# Patient Record
Sex: Male | Born: 1995 | Race: White | Hispanic: No | Marital: Single | Smoking: Never smoker
Health system: Southern US, Community
[De-identification: ages and names within clinical notes are randomized; demographics above are authoritative.]

## PROBLEM LIST (undated history)

## (undated) ENCOUNTER — Ambulatory Visit: Payer: 59 | Source: Home / Self Care

## (undated) DIAGNOSIS — IMO0001 Reserved for inherently not codable concepts without codable children: Secondary | ICD-10-CM

## (undated) HISTORY — PX: SURGERY SCROTAL / TESTICULAR: SUR1316

---
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
## 2018-10-04 ENCOUNTER — Ambulatory Visit
Admission: EM | Admit: 2018-10-04 | Discharge: 2018-10-04 | Disposition: A | Discharge status: Home / Self Care | Payer: Self-pay | Attending: Physician Assistant | Admitting: Physician Assistant

## 2018-10-04 ENCOUNTER — Other Ambulatory Visit: Payer: Self-pay

## 2018-10-04 DIAGNOSIS — J302 Other seasonal allergic rhinitis: Secondary | ICD-10-CM

## 2018-10-04 MED ORDER — IPRATROPIUM BROMIDE 0.06 % NA SOLN: 2.0000 | Freq: Four times a day (QID) | NASAL | 12 refills | Status: DC

## 2018-10-04 MED ORDER — CETIRIZINE HCL 10 MG PO TABS: 10.0000 mg | ORAL_TABLET | Freq: Every day | ORAL | 0 refills | Status: DC

## 2018-10-04 MED ORDER — FLUTICASONE PROPIONATE 50 MCG/ACT NA SUSP: 2.0000 | Freq: Every day | NASAL | 0 refills | Status: DC

## 2018-10-04 NOTE — Discharge Instructions (Signed)
Start zyrtec as directed. Start flonase, atrovent nasal spray for nasal congestion/drainage. You can use over the counter nasal saline rinse such as neti pot for nasal congestion. Keep hydrated, your urine should be clear to pale yellow in color. Monitor for any worsening of symptoms, chest pain, shortness of breath, wheezing, swelling of the throat, follow up for reevaluation.

## 2018-10-04 NOTE — ED Provider Notes (Signed)
[facility]    CSN: [id number] Arrival date & time: 10/04/18  1705     History   Chief Complaint Chief Complaint  Patient presents with  . Facial Pain    HPI Justin Guzman is a 23 y.o. male.   23 year old male comes in for 2-day history of left-sided facial pain/pressure.  He also has postnasal drip.  Denies rhinorrhea, nasal congestion, cough.  Denies fever, chills, night sweats.  Denies photophobia, phonophobia, nausea, vomiting.  Denies shortness of breath, wheezing.  Denies dental pain, gum swelling.  Denies recent sick contact.  Denies COVID contact.  Took ibuprofen with some relief.  Noticed some colored mucus that he "forced to come up" and came in for evaluation of possible sinus infection.     Past Medical History:  Diagnosis Date  . Undescended and retracted testis     There are no active problems to display for this patient.   Past Surgical History:  Procedure Laterality Date  . SURGERY SCROTAL / TESTICULAR         Home Medications    Prior to Admission medications   Medication Sig Start Date End Date Taking? Authorizing Provider  cetirizine (ZYRTEC ALLERGY) 10 MG tablet Take 1 tablet (10 mg total) by mouth daily. 10/04/18   Cathie Hoops, Amy V, PA-C  fluticasone (FLONASE) 50 MCG/ACT nasal spray Place 2 sprays into both nostrils daily. 10/04/18   Cathie Hoops, Amy V, PA-C  ipratropium (ATROVENT) 0.06 % nasal spray Place 2 sprays into both nostrils 4 (four) times daily. 10/04/18   Belinda Fisher, PA-C    Family History No family history on file.  Social History Social History   Tobacco Use  . Smoking status: Never Smoker  . Smokeless tobacco: Never Used  Substance Use Topics  . Alcohol use: Not Currently  . Drug use: Not on file     Allergies   Patient has no known allergies.   Review of Systems Review of Systems  Reason unable to perform ROS: See HPI as above.     Physical Exam Triage Vital Signs ED Triage Vitals [10/04/18 1727]  Enc Vitals  Group     BP (!) 153/88     Pulse Rate 90     Resp 18     Temp 98.1 F (36.7 C)     Temp Source Oral     SpO2 98 %     Weight      Height      Head Circumference      Peak Flow      Pain Score 4     Pain Loc      Pain Edu?      Excl. in GC?    No data found.  Updated Vital Signs BP (!) 153/88 (BP Location: Left Arm)   Pulse 90   Temp 98.1 F (36.7 C) (Oral)   Resp 18   SpO2 98%   Physical Exam Constitutional:      General: He is not in acute distress.    Appearance: He is well-developed. He is not ill-appearing, toxic-appearing or diaphoretic.  HENT:     Head: Normocephalic and atraumatic.     Right Ear: Ear canal and external ear normal. A middle ear effusion is present. Tympanic membrane is not erythematous or bulging.     Left Ear: Ear canal and external ear normal. A middle ear effusion is present. Tympanic membrane is scarred. Tympanic membrane is not erythematous or bulging.  Nose: Nose normal.     Right Sinus: No maxillary sinus tenderness or frontal sinus tenderness.     Left Sinus: No maxillary sinus tenderness or frontal sinus tenderness.     Mouth/Throat:     Mouth: Mucous membranes are moist.     Pharynx: Oropharynx is clear. Uvula midline.  Eyes:     Conjunctiva/sclera: Conjunctivae normal.     Pupils: Pupils are equal, round, and reactive to light.  Neck:     Musculoskeletal: Normal range of motion and neck supple.  Cardiovascular:     Rate and Rhythm: Normal rate and regular rhythm.     Heart sounds: Normal heart sounds. No murmur. No friction rub. No gallop.   Pulmonary:     Effort: Pulmonary effort is normal. No accessory muscle usage, prolonged expiration, respiratory distress or retractions.     Breath sounds: Normal breath sounds. No stridor, decreased air movement or transmitted upper airway sounds. No decreased breath sounds, wheezing, rhonchi or rales.  Skin:    General: Skin is warm and dry.  Neurological:     Mental Status: He is  alert and oriented to person, place, and time.      UC Treatments / Results  Labs (all labs ordered are listed, but only abnormal results are displayed) Labs Reviewed - No data to display  EKG None  Radiology No results found.  Procedures Procedures (including critical care time)  Medications Ordered in UC Medications - No data to display  Initial Impression / Assessment and Plan / UC Course  I have reviewed the triage vital signs and the nursing notes.  Pertinent labs & imaging results that were available during my care of the patient were reviewed by me and considered in my medical decision making (see chart for details).    History and exam more consistent with allergic rhinitis. No fever, cough, shortness of breath, body aches, chills, lower suspicion for viral process. Symptomatic treatment discussed. Push fluids. Return precautions given. Patient expresses understanding and agrees to plan.  Final Clinical Impressions(s) / UC Diagnoses   Final diagnoses:  Seasonal allergic rhinitis, unspecified trigger    ED Prescriptions    Medication Sig Dispense Auth. Provider   fluticasone (FLONASE) 50 MCG/ACT nasal spray Place 2 sprays into both nostrils daily. 1 g Yu, Amy V, PA-C   cetirizine (ZYRTEC ALLERGY) 10 MG tablet Take 1 tablet (10 mg total) by mouth daily. 30 tablet Yu, Amy V, PA-C   ipratropium (ATROVENT) 0.06 % nasal spray Place 2 sprays into both nostrils 4 (four) times daily. 15 mL Threasa AlphaYu, Amy V, PA-C        Yu, Amy V, [place] 10/04/18 1744

## 2018-10-04 NOTE — ED Triage Notes (Signed)
Pt c/o sinus pressure to lt side of face x2 days

## 2021-02-11 ENCOUNTER — Other Ambulatory Visit: Payer: Self-pay

## 2021-02-11 ENCOUNTER — Ambulatory Visit
Admission: EM | Admit: 2021-02-11 | Discharge: 2021-02-11 | Disposition: A | Discharge status: Home / Self Care | Payer: 59 | Attending: Urgent Care | Admitting: Urgent Care

## 2021-02-11 DIAGNOSIS — H8110 Benign paroxysmal vertigo, unspecified ear: Secondary | ICD-10-CM

## 2021-02-11 DIAGNOSIS — I10 Essential (primary) hypertension: Secondary | ICD-10-CM

## 2021-02-11 DIAGNOSIS — Z8249 Family history of ischemic heart disease and other diseases of the circulatory system: Secondary | ICD-10-CM

## 2021-02-11 MED ORDER — LOSARTAN POTASSIUM 25 MG PO TABS: 25.0000 mg | ORAL_TABLET | Freq: Every day | ORAL | 0 refills | Status: AC

## 2021-02-11 MED ORDER — MECLIZINE HCL 12.5 MG PO TABS: 12.5000 mg | ORAL_TABLET | Freq: Three times a day (TID) | ORAL | 0 refills | Status: DC | PRN

## 2021-02-11 NOTE — Discharge Instructions (Addendum)

## 2021-02-11 NOTE — ED Provider Notes (Signed)
[facility]   MRN: [id number] DOB: [birth year]  Subjective:   Justin Guzman is a 25 y.o. male presenting for dizziness and intermittent nausea for the past 5 days.  Patient states that the dizziness lasts several seconds to a minute.  It occurs randomly but also very consistently when he changes positions involving his head.  Has had slight intermittent headaches that are mild in nature.  Denies confusion, weakness, numbness or tingling, vision changes, ear pain, runny or stuffy nose, symptoms, cough, chest pain, shortness of breath, vomiting, abdominal pain.  Patient does not have any history of allergies.  Regarding his blood pressure, reports longstanding family history of heart disease, high blood pressure.  Would like to start blood pressure medications today.  Does not have a PCP.  No current facility-administered medications for this encounter.  Current Outpatient Medications:    cetirizine (ZYRTEC ALLERGY) 10 MG tablet, Take 1 tablet (10 mg total) by mouth daily., Disp: 30 tablet, Rfl: 0   fluticasone (FLONASE) 50 MCG/ACT nasal spray, Place 2 sprays into both nostrils daily., Disp: 1 g, Rfl: 0   ipratropium (ATROVENT) 0.06 % nasal spray, Place 2 sprays into both nostrils 4 (four) times daily., Disp: 15 mL, Rfl: 12   No Known Allergies  Past Medical History:  Diagnosis Date   Undescended and retracted testis      Past Surgical History:  Procedure Laterality Date   SURGERY SCROTAL / TESTICULAR      History reviewed. No pertinent family history.  Social History   Tobacco Use   Smoking status: Never   Smokeless tobacco: Never  Substance Use Topics   Alcohol use: Not Currently    ROS   Objective:   Vitals: BP (!) 163/94 (BP Location: Left Arm)   Pulse 85   Temp 98.4 F (36.9 C) (Oral)   Resp 18   SpO2 97%   BP Readings from Last 3 Encounters:  02/11/21 (!) 163/94  10/04/18 (!) 153/88  05/10/13 140/77   Physical Exam Constitutional:       General: He is not in acute distress.    Appearance: Normal appearance. He is well-developed and normal weight. He is not ill-appearing, toxic-appearing or diaphoretic.  HENT:     Head: Normocephalic and atraumatic.     Right Ear: Tympanic membrane, ear canal and external ear normal. There is no impacted cerumen.     Left Ear: Tympanic membrane, ear canal and external ear normal. There is no impacted cerumen.     Nose: Nose normal. No congestion or rhinorrhea.     Mouth/Throat:     Mouth: Mucous membranes are moist.     Pharynx: Oropharynx is clear. No oropharyngeal exudate or posterior oropharyngeal erythema.  Eyes:     General: No scleral icterus.       Right eye: No discharge.        Left eye: No discharge.     Extraocular Movements: Extraocular movements intact.     Conjunctiva/sclera: Conjunctivae normal.     Pupils: Pupils are equal, round, and reactive to light.  Cardiovascular:     Rate and Rhythm: Normal rate.  Pulmonary:     Effort: Pulmonary effort is normal.  Musculoskeletal:     Cervical back: Normal range of motion and neck supple. No rigidity. No muscular tenderness.  Neurological:     General: No focal deficit present.     Mental Status: He is alert and oriented to person, place, and time.  Cranial Nerves: No cranial nerve deficit.     Motor: No weakness.     Coordination: Coordination normal.     Gait: Gait normal.     Deep Tendon Reflexes: Reflexes normal.     Comments: Negative Romberg and pronator drift.  Psychiatric:        Mood and Affect: Mood normal.        Behavior: Behavior normal.        Thought Content: Thought content normal.        Judgment: Judgment normal.      Assessment and Plan :   PDMP not reviewed this encounter.  1. Benign paroxysmal positional vertigo, unspecified laterality   2. Essential hypertension   3. Family history of heart disease     Will manage for BPPV with meclizine, conservative management.  No signs of an acute  encephalopathy on exam.  Regarding his blood pressure, recommended hypertensive friendly diet, starting losartan at 25 mg once daily.  Follow-up with new PCP, information provided to him for this. Counseled patient on potential for adverse effects with medications prescribed/recommended today, ER and return-to-clinic precautions discussed, patient verbalized understanding.    [name], [place] 02/11/21 1042

## 2021-02-11 NOTE — ED Triage Notes (Signed)
Pt c/o dizziness and nausea onset a few days ago. States it happens when he lies down at night and if he rolls over in bed. Also c/o slight headache  Denies vomiting, diarrhea or constipation, heart conditions, trauma to ears.

## 2021-03-18 ENCOUNTER — Other Ambulatory Visit: Payer: Self-pay

## 2021-03-18 ENCOUNTER — Telehealth (INDEPENDENT_AMBULATORY_CARE_PROVIDER_SITE_OTHER): Payer: 59 | Admitting: Nurse Practitioner

## 2021-03-18 ENCOUNTER — Encounter: Payer: Self-pay | Admitting: Nurse Practitioner

## 2021-03-18 ENCOUNTER — Ambulatory Visit: Payer: 59 | Admitting: Family Medicine

## 2021-03-18 DIAGNOSIS — H8113 Benign paroxysmal vertigo, bilateral: Secondary | ICD-10-CM | POA: Diagnosis not present

## 2021-03-18 MED ORDER — MECLIZINE HCL 12.5 MG PO TABS: 12.5000 mg | ORAL_TABLET | Freq: Three times a day (TID) | ORAL | 0 refills | Status: AC | PRN

## 2021-03-18 NOTE — Progress Notes (Signed)
Virtual Visit via Video Note  I connected with Justin Guzman on 03/18/21 at  1:40 PM EDT by a video enabled telemedicine application and verified that I am speaking with the correct person using two identifiers.  Location: Patient: home Provider: office   I discussed the limitations of evaluation and management by telemedicine and the availability of in person appointments. The patient expressed understanding and agreed to proceed.  History of Present Illness:  Patient presents today for televisit.  He was seen in the ED on 02/11/2021 for benign paroxysmal positional vertigo.  He was prescribed meclizine.  He states that this did improve his symptoms but he is now run out of the medication.  He states that his symptoms have returned since he has been off the medication.  We discussed that we can refill this medication but we will also refer him to physical therapy for Epley maneuvers. Denies f/c/s, n/v/d, hemoptysis, PND, chest pain or edema.     Observations/Objective:  Vitals with BMI 02/11/2021 10/04/2018 05/10/2013  Weight - - 148 lbs 2 oz  Systolic 163 153 517  Diastolic 94 88 77  Pulse 85 90 71      Assessment and Plan:  Patient Instructions  Vertigo:  Continue current medications  Will refill meclizine   Will place referral for PT for Epley maneuvers  Follow up:  Follow up if needed     I discussed the assessment and treatment plan with the patient. The patient was provided an opportunity to ask questions and all were answered. The patient agreed with the plan and demonstrated an understanding of the instructions.   The patient was advised to call back or seek an in-person evaluation if the symptoms worsen or if the condition fails to improve as anticipated.  I provided 23 minutes of non-face-to-face time during this encounter.   Ivonne Andrew, NP

## 2021-03-18 NOTE — Patient Instructions (Addendum)
Vertigo:  Continue current medications  Will refill meclizine   Will place referral for PT for Epley maneuvers  Follow up:  Follow up if needed

## 2021-03-22 ENCOUNTER — Ambulatory Visit: Payer: 59 | Attending: Nurse Practitioner | Admitting: Physical Therapy

## 2021-03-22 ENCOUNTER — Other Ambulatory Visit: Payer: Self-pay

## 2021-03-22 ENCOUNTER — Encounter: Payer: Self-pay | Admitting: Physical Therapy

## 2021-03-22 DIAGNOSIS — H8113 Benign paroxysmal vertigo, bilateral: Secondary | ICD-10-CM | POA: Insufficient documentation

## 2021-03-22 DIAGNOSIS — R42 Dizziness and giddiness: Secondary | ICD-10-CM | POA: Diagnosis present

## 2021-03-22 DIAGNOSIS — R2681 Unsteadiness on feet: Secondary | ICD-10-CM | POA: Diagnosis present

## 2021-03-22 NOTE — Therapy (Signed)
[facility] [address], [address] [place], [place], [address] Phone: [phone]   Fax:  [phone]  Physical Therapy Evaluation  Patient Details  Name: Justin Guzman MRN: [id number] Date of Birth: [birth year] Referring Provider (PT): Ivonne Andrew, NP   Encounter Date: 03/22/2021   PT End of Session - 03/22/21 1636     Visit Number 1    Number of Visits 9    Date for PT Re-Evaluation 05/17/21    Authorization Type UHC    Authorization Time Period 23 visits/fiscal year 02/20/21-02/19/22    Authorization - Visit Number 1    Authorization - Number of Visits 23    PT Start Time 1534    PT Stop Time 1622    PT Time Calculation (min) 48 min    Activity Tolerance Patient tolerated treatment well    Behavior During Therapy [facility] for tasks assessed/performed             Past Medical History:  Diagnosis Date   Undescended and retracted testis     Past Surgical History:  Procedure Laterality Date   SURGERY SCROTAL / TESTICULAR      There were no vitals filed for this visit.    Subjective Assessment - 03/22/21 1535     Subjective Patient reports dizziness for the past 2-3 weeks. Had some dizziness during the day one day, which got worse the following night, especially when rolling. Had an episode of falling on the floor from dizziness when trying to pick something off the floor. Went to the ED was prescribed BP meds and Meclizine. Once he ran out of the meds, he was having remaining dizziness with head turns or looking down, sometimes when rolling or laying in bed. Better when moving slowly. Notes some decreased hearing in the L ear since being in the [place], but this is not new. Denies head trauma, infection/illness, vision changes/double vision, hearing loss, tinnitus, otalgia, photo/phonophobia. Episodes last seconds. Describes it as spinning. Noting minor HAs over B temples- possibly d/t season change as he has a hx of seasonal  allergies.    Pertinent History none    Limitations Lifting;Standing;Walking;House hold activities;Sitting    Diagnostic tests none recent    Patient Stated Goals decrease dizziness    Currently in Pain? No/denies                [facility] PT Assessment - 03/22/21 1542       Assessment   Medical Diagnosis Benign paroxysmal positional vertigo due to bilateral vestibular disorder    Referring Provider (PT) Ivonne Andrew, NP    Onset Date/Surgical Date 03/01/21    Next MD Visit not scheduled    Prior Therapy no      Precautions   Precautions Fall      Balance Screen   Has the patient fallen in the past 6 months Yes    How many times? 1    Has the patient had a decrease in activity level because of a fear of falling?  No    Is the patient reluctant to leave their home because of a fear of falling?  No      Home Environment   Living Environment Private residence    Living Arrangements Spouse/significant other    Available Help at Discharge Family    Type of Home House      Prior Function   Level of Independence Independent    Vocation Full time  employment    Development worker, [employer]- paper work, driving, walking, standing    Leisure none      Cognition   Overall Cognitive Status Within Functional Limits for tasks assessed      Sensation   Light Touch Appears Intact      Coordination   Gross Motor Movements are Fluid and Coordinated Yes    Finger Nose Finger Test intact B   c/o "hesitation"   Heel Shin Test intact B alt pronation/supination      Posture/Postural Control   Posture/Postural Control Postural limitations    Postural Limitations Rounded Shoulders;Forward head                    Vestibular Assessment - 03/22/21 1544       Oculomotor Exam   Oculomotor Alignment Normal    Ocular ROM WFL    Spontaneous Absent    Gaze-induced  Absent    Smooth Pursuits Intact    Saccades Slow   slow and several corrective saccades in  vertical and horizontal directions   Comment convergence: c/o blurred vision ~6 min      Oculomotor Exam-Fixation Suppressed    Left Head Impulse intact    Right Head Impulse intact      Vestibulo-Ocular Reflex   VOR 1 Head Only (x 1 viewing) c/o trouble focusing at quick pace in horizontal direction; intact vertical    VOR Cancellation Normal      Positional Testing   Dix-Hallpike Dix-Hallpike Right;Dix-Hallpike Left    Horizontal Canal Testing Horizontal Canal Right;Horizontal Canal Left      Dix-Hallpike Right   Dix-Hallpike Right Duration 0    Dix-Hallpike Right Symptoms No nystagmus      Dix-Hallpike Left   Dix-Hallpike Left Duration 0    Dix-Hallpike Left Symptoms No nystagmus      Horizontal Canal Right   Horizontal Canal Right Duration 0    Horizontal Canal Right Symptoms Normal      Horizontal Canal Left   Horizontal Canal Left Duration 0    Horizontal Canal Left Symptoms Normal   c/o eyes "catching up"               Objective measurements completed on examination: See above findings.        Vestibular Treatment/Exercise - 03/22/21 0001       Vestibular Treatment/Exercise   Vestibular Treatment Provided Habituation    Habituation Exercises Gari Crown Daroff   Number of Reps  1    Symptom Description  c/o mild dizziness upon laying down/sitting up; more severe with EC                    PT Education - 03/22/21 1635     Education Details prognosis, POC, HEP; edu on etiology of vestibular neuritis and advised patient to get set up with PCP d/t abnormal oculomotor testing    Person(s) Educated Patient    Methods Explanation;Demonstration;Tactile cues;Handout;Verbal cues    Comprehension Verbalized understanding;Returned demonstration              PT Short Term Goals - 03/22/21 1646       PT SHORT TERM GOAL #1   Title Patient to be independent with initial HEP.    Time 3    Period Weeks    Status New     Target Date 04/12/21  PT Long Term Goals - 03/22/21 1647       PT LONG TERM GOAL #1   Title Patient to be independent with advanced HEP.    Time 8    Period Weeks    Status New    Target Date 05/17/21      PT LONG TERM GOAL #2   Title Patient to report 0/10 dizziness with standing vertical and horizontal head movements at variable speeds.    Time 8    Period Weeks    Status New    Target Date 05/17/21      PT LONG TERM GOAL #3   Title Patient will report 0/10 dizziness with bed mobility with EO/EC.    Time 8    Period Weeks    Status New    Target Date 05/17/21      PT LONG TERM GOAL #4   Title Patient to demonstrate mild sway with M-CTSIB condition with eyes closed/foam surface in order to improve safety in environments with uneven surfaces and dim lighting.    Time 8    Period Weeks    Status New    Target Date 05/17/21      PT LONG TERM GOAL #5   Title Patient to score at least 20/24 on DGI in order to decrease risk of falls.    Time 8    Period Weeks    Status New    Target Date 05/17/21                    Plan - 03/22/21 1637     Clinical Impression Statement Patient is a 25 y/o M presenting to OPPT with c/o acute dizziness for the past 2-3 weeks. Patient reports episodes lasting seconds and describes it as "spinning." Dizziness is improved from initial onset but still aggravated by head turns, looking down, rolling or laying in bed. Denies head trauma, infection/illness, vision changes/double vision, hearing loss, tinnitus, otalgia, photo/phonophobia. Noting minor HAs over B temples- possibly d/t seasonal allergies. Reports 1 fall since onset of dizziness. Oculomotor exam revealed slow and corrective saccades with saccadic testing, convergence insufficiency, c/o difficulty focusing with horizontal VOR. Positional testing was negative, however patient did demonstrate motion sensitivity with B Austin Miles, particularly with EC. Patient's  referring NP was notified of abnormal oculomotor test results. 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 Would benefit from skilled PT services 1x/week for 8 weeks to address aforementioned impairments in order to optimize level of function.    Personal Factors and Comorbidities Age;Time since onset of injury/illness/exacerbation;Profession;Past/Current Experience    Examination-Activity Limitations Locomotion Level;Bathing;Bed Mobility;Reach Overhead;Bend;Carry;Sleep;Squat;Stairs;Dressing;Hygiene/Grooming;Stand;Lift    Examination-Participation Restrictions [employer];Shop;Driving;Community Activity;Cleaning;Occupation;Church;Meal Prep    Stability/Clinical Decision Making Unstable/Unpredictable    Clinical Decision Making High    Rehab Potential Good    PT Frequency 1x / week    PT Duration 8 weeks    PT Treatment/Interventions ADLs/Self Care Home Management;Canalith Repostioning;Cryotherapy;Electrical Stimulation;Moist Heat;Gait training;Stair training;Functional mobility training;Therapeutic activities;Therapeutic exercise;Balance training;Manual techniques;Patient/family education;Passive range of motion;Dry needling;Energy conservation;Visual/perceptual remediation/compensation;Vestibular;Taping    PT Next Visit Plan reassess HEP; assess DGI and M-CTSIB; progress VOR training and habituation    Consulted and Agree with Plan of Care Patient             Patient will benefit from skilled therapeutic intervention in order to improve the following deficits and impairments:  Dizziness, Decreased activity tolerance, Decreased balance, Improper body mechanics, Postural dysfunction  Visit Diagnosis: Dizziness and giddiness  Unsteadiness on feet     Problem List There are no problems to display for this patient.    Anette Guarneri, PT, DPT 03/22/21 4:50 PM   [facility] [address], [address] [place], [place], [address] Phone: [phone]   Fax:  [phone]  Name: Justin Guzman MRN: [id number] Date of Birth: [birth year]

## 2021-03-23 ENCOUNTER — Ambulatory Visit: Payer: 59 | Admitting: Family Medicine

## 2021-03-31 ENCOUNTER — Other Ambulatory Visit: Payer: Self-pay | Admitting: Nurse Practitioner

## 2021-03-31 DIAGNOSIS — H8113 Benign paroxysmal vertigo, bilateral: Secondary | ICD-10-CM

## 2021-04-01 ENCOUNTER — Ambulatory Visit: Payer: Self-pay | Admitting: Physical Therapy

## 2021-04-08 ENCOUNTER — Ambulatory Visit: Payer: 59 | Attending: Nurse Practitioner | Admitting: Physical Therapy

## 2021-04-08 ENCOUNTER — Encounter: Payer: Self-pay | Admitting: Physical Therapy

## 2021-04-08 ENCOUNTER — Other Ambulatory Visit: Payer: Self-pay

## 2021-04-08 VITALS — BP 139/99 | HR 70

## 2021-04-08 DIAGNOSIS — R42 Dizziness and giddiness: Secondary | ICD-10-CM | POA: Diagnosis present

## 2021-04-08 DIAGNOSIS — R2681 Unsteadiness on feet: Secondary | ICD-10-CM | POA: Diagnosis present

## 2021-04-08 NOTE — Therapy (Signed)
[facility] [address] [place], [place], [address] Phone: [phone]   Fax:  [phone]  Physical Therapy Treatment  Patient Details  Name: Justin Guzman MRN: [id number] Date of Birth: 30-Mar-1996 Referring Provider (PT): Ivonne Andrew, NP   Encounter Date: 04/08/2021   PT End of Session - 04/08/21 1020     Visit Number 2    Number of Visits 9    Date for PT Re-Evaluation 05/17/21    Authorization Type UHC    Authorization Time Period 23 visits/fiscal year 02/20/21-02/19/22    Authorization - Visit Number 2    Authorization - Number of Visits 23    PT Start Time 0933    PT Stop Time 1014    PT Time Calculation (min) 41 min    Equipment Utilized During Treatment Gait belt    Activity Tolerance Patient tolerated treatment well    Behavior During Therapy WFL for tasks assessed/performed             Past Medical History:  Diagnosis Date   Undescended and retracted testis     Past Surgical History:  Procedure Laterality Date   SURGERY SCROTAL / TESTICULAR      Vitals:   04/08/21 0935  BP: (!) 139/99  Pulse: 70     Subjective Assessment - 04/08/21 0933     Subjective Has not had as much dizziness, however did have an episode of dizziness last night when rolling over with his EC. Episode lasted 10 sec and c/o disoriented and eyes "delayed" rather than spinning.    Pertinent History none    Diagnostic tests none recent    Patient Stated Goals decrease dizziness    Currently in Pain? No/denies                [facility] PT Assessment - 04/08/21 0001       Standardized Balance Assessment   Standardized Balance Assessment Dynamic Gait Index      Dynamic Gait Index   Level Surface Normal    Change in Gait Speed Normal    Gait with Horizontal Head Turns Normal    Gait with Vertical Head Turns Normal    Gait and Pivot Turn Normal    Step Over Obstacle Normal    Step Around Obstacles Mild Impairment     Steps Normal   per pt's report   Total Score 23      High Level Balance   High Level Balance Comments M-CTSIB: EO/firm WNL, EC/firm mild sway L, EO/foam mild sway L, EC/foam moderate sway L/R                 Vestibular Assessment - 04/08/21 0001       Oculomotor Exam-Fixation Suppressed    Left Head Impulse positive    Right Head Impulse intact                      [facility] Adult PT Treatment/Exercise - 04/08/21 0001       Neuro Re-ed    Neuro Re-ed Details  R/L forearm prop + gaze stabiliation 3x each; with EC 3x each   c/o 5/10 dizziness from [facility]            Vestibular Treatment/Exercise - 04/08/21 0001       Vestibular Treatment/Exercise   Habituation Exercises Francee Piccolo Daroff;Standing Horizontal Head Turns;Standing Vertical Head Turns      Austin Miles   Number of  Reps  3    Symptom Description  c/o 1-2/10 "pulling sensation" with EO from L; c/o 3/10 dizziness upon sitting up from R/L side with EC; same c/o in EC with feet on dynadisc      Standing Horizontal Head Turns   Number of Reps  --   2x10   Symptom Description  standing wide and romberg wtih EC      Standing Vertical Head Turns   Number of Reps  --   2x10   Symptom Description  standing wide and romberg wtih EC                    PT Education - 04/08/21 1020     Education Details update to HEP; Access code: [email or web address]    Person(s) Educated Patient    Methods Explanation;Demonstration;Tactile cues;Verbal cues;Handout    Comprehension Verbalized understanding;Returned demonstration              PT Short Term Goals - 04/08/21 1021       PT SHORT TERM GOAL #1   Title Patient to be independent with initial HEP.    Time 3    Period Weeks    Status Achieved    Target Date 04/12/21               PT Long Term Goals - 04/08/21 1021       PT LONG TERM GOAL #1   Title Patient to be independent with advanced HEP.    Time 8    Period Weeks    Status On-going       PT LONG TERM GOAL #2   Title Patient to report 0/10 dizziness with standing vertical and horizontal head movements at variable speeds.    Time 8    Period Weeks    Status On-going      PT LONG TERM GOAL #3   Title Patient will report 0/10 dizziness with bed mobility with EO/EC.    Time 8    Period Weeks    Status On-going      PT LONG TERM GOAL #4   Title Patient to demonstrate mild sway with M-CTSIB condition with eyes closed/foam surface in order to improve safety in environments with uneven surfaces and dim lighting.    Time 8    Period Weeks    Status On-going      PT LONG TERM GOAL #5   Title Patient to score at least 20/24 on DGI in order to decrease risk of falls.    Time 8    Period Weeks    Status Achieved                   Plan - 04/08/21 1020     Clinical Impression Statement Patient arrived to session with report of improving dizziness, however still experiencing episodes at night with EC while rolling over in bed. Denies spinning sensation. Patient's score on DGI indicated a decreased risk of falls. Assessed M-CTSIB which revealed considerable L pulsion, particularly with EC/foam surface. 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 Worked on progressing habituation exercises today by removing visual fixation and adjusting speed of movement. Worked on standing exercise with EC for max challenge. Patient reported understanding of HEP update and without complaints at end of session. Patient progressing well towards goals.    Examination-Activity Limitations Locomotion Level;Bathing;Bed Mobility;Reach Overhead;Bend;Carry;Sleep;Squat;Stairs;Dressing;Hygiene/Grooming;Stand;Lift    PT Treatment/Interventions ADLs/Self Care Home Management;Canalith Repostioning;Cryotherapy;Electrical Stimulation;Moist Heat;Gait training;Stair training;Functional mobility training;Therapeutic  activities;Therapeutic exercise;Balance training;Manual  techniques;Patient/family education;Passive range of motion;Dry needling;Energy conservation;Visual/perceptual remediation/compensation;Vestibular;Taping    PT Next Visit Plan progress VOR training and habituation, balance with EC/compliant surface    Consulted and Agree with Plan of Care Patient             Patient will benefit from skilled therapeutic intervention in order to improve the following deficits and impairments:  Dizziness, Decreased activity tolerance, Decreased balance, Improper body mechanics, Postural dysfunction  Visit Diagnosis: Dizziness and giddiness  Unsteadiness on feet     Problem List There are no problems to display for this patient.   Anette Guarneri, PT, DPT 04/08/21 10:22 AM   [facility] [address] [place], [place], [address] Phone: [phone]   Fax:  [phone]  Name: SHMIEL MORTON MRN: [id number] Date of Birth: 1995-12-26

## 2021-04-14 ENCOUNTER — Ambulatory Visit: Payer: Self-pay | Admitting: Physical Therapy

## 2021-04-22 ENCOUNTER — Encounter: Payer: Self-pay | Admitting: Physical Therapy

## 2021-04-22 ENCOUNTER — Ambulatory Visit: Payer: 59 | Attending: Nurse Practitioner | Admitting: Physical Therapy

## 2021-04-22 ENCOUNTER — Other Ambulatory Visit: Payer: Self-pay

## 2021-04-22 DIAGNOSIS — R42 Dizziness and giddiness: Secondary | ICD-10-CM | POA: Diagnosis not present

## 2021-04-22 DIAGNOSIS — R2681 Unsteadiness on feet: Secondary | ICD-10-CM | POA: Diagnosis present

## 2021-04-22 NOTE — Therapy (Signed)
[facility] [address] [place], [place], [address] Phone: [phone]   Fax:  [phone]  Physical Therapy Treatment  Patient Details  Name: Justin Guzman MRN: [id number] Date of Birth: [birth year] Referring Provider (PT): Ivonne Andrew, NP   Encounter Date: 04/22/2021   PT End of Session - 04/22/21 1011     Visit Number 3    Number of Visits 9    Date for PT Re-Evaluation 05/17/21    Authorization Type UHC    Authorization Time Period 23 visits/fiscal year 02/20/21-02/19/22    Authorization - Visit Number 3    Authorization - Number of Visits 23    PT Start Time 0921    PT Stop Time 1003    PT Time Calculation (min) 42 min    Equipment Utilized During Treatment Gait belt    Activity Tolerance Patient tolerated treatment well    Behavior During Therapy WFL for tasks assessed/performed             Past Medical History:  Diagnosis Date   Undescended and retracted testis     Past Surgical History:  Procedure Laterality Date   SURGERY SCROTAL / TESTICULAR      There were no vitals filed for this visit.   Subjective Assessment - 04/22/21 0920     Subjective Altogether, feeling pretty good. Still having some dizziness when turning here and there and with laying down with eyes closed. Dizziness has gotten a bit better than last session. Doing HEP but not daily.    Pertinent History none    Diagnostic tests none recent    Patient Stated Goals decrease dizziness    Currently in Pain? No/denies                               [facility] Adult PT Treatment/Exercise - 04/22/21 0001       Neuro Re-ed    Neuro Re-ed Details  R/L forearm prop with EC 3x, sitting on dynadisc with EC 3x 2/10 dizziness and pressure behind the eyes             Vestibular Treatment/Exercise - 04/22/21 0001       Vestibular Treatment/Exercise   Habituation Exercises Francee Piccolo Daroff;Standing Horizontal Head  Turns;Standing Vertical Head Turns      Brandt Daroff   Number of Reps  3    Symptom Description  with EC and feet hanging off edge of mat; c/o "pushing" sensation from R   c/o slightly increased symptoms with increased speed     Standing Horizontal Head Turns   Number of Reps  --   x10   Symptom Description  romberg wtih EC   cues to increase speed     Standing Vertical Head Turns   Number of Reps  --   x10   Symptom Description  romberg wtih EC   c/o mild dizziness and unsteadiness               Balance Exercises - 04/22/21 0001       Balance Exercises: Standing   Standing Eyes Closed Narrow base of support (BOS);2 reps;30 secs;Limitations    Standing Eyes Closed Limitations mild- mod sway    Gait with Head Turns Forward;Limitations    Gait with Head Turns Limitations 2x72ft head turns and nods   c/o dizziness and L pulsion with head nods   Turning Right;Left;Limitations;10 reps  Turning Limitations turning 180 deg to 2 targets; c/o more severe dizziness to R; corrective saccades visible                PT Education - 04/22/21 1010     Education Details update to HEP- Access code: [email or web address]; edu on importance of daily HEP performance for max benefit    Person(s) Educated Patient    Methods Explanation;Demonstration;Tactile cues;Verbal cues;Handout    Comprehension Verbalized understanding;Returned demonstration              PT Short Term Goals - 04/08/21 1021       PT SHORT TERM GOAL #1   Title Patient to be independent with initial HEP.    Time 3    Period Weeks    Status Achieved    Target Date 04/12/21               PT Long Term Goals - 04/08/21 1021       PT LONG TERM GOAL #1   Title Patient to be independent with advanced HEP.    Time 8    Period Weeks    Status On-going      PT LONG TERM GOAL #2   Title Patient to report 0/10 dizziness with standing vertical and horizontal head movements at variable speeds.    Time 8    Period  Weeks    Status On-going      PT LONG TERM GOAL #3   Title Patient will report 0/10 dizziness with bed mobility with EO/EC.    Time 8    Period Weeks    Status On-going      PT LONG TERM GOAL #4   Title Patient to demonstrate mild sway with M-CTSIB condition with eyes closed/foam surface in order to improve safety in environments with uneven surfaces and dim lighting.    Time 8    Period Weeks    Status On-going      PT LONG TERM GOAL #5   Title Patient to score at least 20/24 on DGI in order to decrease risk of falls.    Time 8    Period Weeks    Status Achieved                   Plan - 04/22/21 1011     Clinical Impression Statement Patient arrived to session with report of remaining dizziness when turning and laying down with EC. Reports compliance with HEP but not daily. Reviewed HEP for max carryover and assessment of remaining symptoms, which revealed remaining mild dizziness with most activities. Encouraged patient to increase pace of movement to increase challenge. Increased challenge with VOR and habituation exercises today. Patient reported most dizziness with head nods and with turning activities to the R. Visible catch up saccades evident with turning activities. Heavily educated patient on importance of daily compliance with HEP for max benefit. Updated HEP with activities that were safely performed today. Patient reported understanding and without complaints at end of session.    Examination-Activity Limitations Locomotion Level;Bathing;Bed Mobility;Reach Overhead;Bend;Carry;Sleep;Squat;Stairs;Dressing;Hygiene/Grooming;Stand;Lift    PT Treatment/Interventions ADLs/Self Care Home Management;Canalith Repostioning;Cryotherapy;Electrical Stimulation;Moist Heat;Gait training;Stair training;Functional mobility training;Therapeutic activities;Therapeutic exercise;Balance training;Manual techniques;Patient/family education;Passive range of motion;Dry needling;Energy  conservation;Visual/perceptual remediation/compensation;Vestibular;Taping    PT Next Visit Plan progress VOR training and habituation, balance with EC/compliant surface    Consulted and Agree with Plan of Care Patient             Patient will benefit from skilled therapeutic  intervention in order to improve the following deficits and impairments:  Dizziness, Decreased activity tolerance, Decreased balance, Improper body mechanics, Postural dysfunction  Visit Diagnosis: Dizziness and giddiness  Unsteadiness on feet     Problem List There are no problems to display for this patient.   Anette Guarneri, PT, DPT 04/22/21 10:13 AM    [facility] [address], [address] [place], [place], [address] Phone: [phone]   Fax:  [phone]  Name: Justin Guzman MRN: [id number] Date of Birth: 15-Nov-1995

## 2021-05-07 ENCOUNTER — Ambulatory Visit: Payer: 59 | Admitting: Psychiatry

## 2021-05-13 ENCOUNTER — Ambulatory Visit: Payer: Self-pay | Admitting: Physical Therapy

## 2021-05-20 ENCOUNTER — Encounter: Payer: Self-pay | Admitting: Physical Therapy

## 2021-05-20 ENCOUNTER — Ambulatory Visit: Payer: 59 | Admitting: Physical Therapy

## 2021-05-20 ENCOUNTER — Other Ambulatory Visit: Payer: Self-pay

## 2021-05-20 DIAGNOSIS — R42 Dizziness and giddiness: Secondary | ICD-10-CM

## 2021-05-20 DIAGNOSIS — R2681 Unsteadiness on feet: Secondary | ICD-10-CM

## 2021-05-20 NOTE — Therapy (Signed)
[facility] [address] [place], [place], [address] Phone: [phone]   Fax:  [phone]  Physical Therapy Discharge Summary  Patient Details  Name: Justin Guzman MRN: [id number] Date of Birth: 09-Sep-1995 Referring Provider (PT): Fenton Foy, NP   Encounter Date: 05/20/2021   PT End of Session - 05/20/21 0946     Visit Number 4    Number of Visits 9    Date for PT Re-Evaluation 05/17/21    Authorization Type [employer]    Authorization Time Period 23 visits/fiscal year 02/20/21-02/19/22    Authorization - Visit Number 4    Authorization - Number of Visits 23    PT Start Time 0900    PT Stop Time 0942    PT Time Calculation (min) 42 min    Activity Tolerance Patient tolerated treatment well    Behavior During Therapy [facility] for tasks assessed/performed             Past Medical History:  Diagnosis Date   Undescended and retracted testis     Past Surgical History:  Procedure Laterality Date   SURGERY SCROTAL / TESTICULAR      There were no vitals filed for this visit.   Subjective Assessment - 05/20/21 0859     Subjective Has been doing his HEP a lot more. Feels like he is getting better by doing them faster. Notices it the most when he is laying down and rolling and when turning his head. Reports 80% improvement in dizziness.    Pertinent History none    Diagnostic tests none recent    Patient Stated Goals decrease dizziness    Currently in Pain? No/denies                [facility] PT Assessment - 05/20/21 0001       High Level Balance   High Level Balance Comments M-CTSIB: EC/foam WNL                 Vestibular Assessment - 05/20/21 0001       Horizontal Canal Right   Horizontal Canal Right Duration 0    Horizontal Canal Right Symptoms Normal   attempted with EC as well- patient noted no dizziness     Horizontal Canal Left   Horizontal Canal Left Duration 0    Horizontal Canal Left  Symptoms Normal   attempted with EC as well- patient noted latent onset onset of dizziness but no nystagmus present                     [facility] Adult PT Treatment/Exercise - 05/20/21 0001       Neuro Re-ed    Neuro Re-ed Details  R/L forearm prop with EC and sitting on dynadisc x5, x3 at differing speeds; gait + head turns and nods to targets and gait + VOR             Vestibular Treatment/Exercise - 05/20/21 0001       Vestibular Treatment/Exercise   Gaze Exercises X1 Viewing Horizontal;X1 Viewing Vertical      Nestor Lewandowsky   Number of Reps  1    Symptom Description  with EC and feet hanging off edge of mat   no c/o dizziness     Standing Horizontal Head Turns   Number of Reps  --   30"   Symptom Description  romberg wtih EC   feeling off/delay in the eyes  Standing Vertical Head Turns   Number of Reps  --   30"   Symptom Description  romberg wtih EC   feeling off/delay in the eyes     X1 Viewing Horizontal   Foot Position standing    Reps 10    Comments 1-2/10 dizziness      X1 Viewing Vertical   Foot Position standing    Reps 10    Comments 0/10 dizziness; c/o some blurred vision                Balance Exercises - 05/20/21 0001       Balance Exercises: Standing   Turning Right;Left;Limitations;10 reps    Turning Limitations turning 180 deg to 2 targets; c/o more severe dizziness to R; corrective saccades visible   cues to increase pace               PT Education - 05/20/21 0945     Education Details review and update to HEP- Access code: [email or web address]    Person(s) Educated Patient    Methods Explanation;Demonstration;Tactile cues;Verbal cues;Handout    Comprehension Verbalized understanding;Returned demonstration              PT Short Term Goals - 05/20/21 0950       PT SHORT TERM GOAL #1   Title Patient to be independent with initial HEP.    Time 3    Period Weeks    Status Achieved    Target Date 04/12/21                PT Long Term Goals - 05/20/21 0950       PT LONG TERM GOAL #1   Title Patient to be independent with advanced HEP.    Time 8    Period Weeks    Status Achieved      PT LONG TERM GOAL #2   Title Patient to report 0/10 dizziness with standing vertical and horizontal head movements at variable speeds.    Time 8    Period Weeks    Status Partially Met   0-2/10 dizziness     PT LONG TERM GOAL #3   Title Patient will report 0/10 dizziness with bed mobility with EO/EC.    Time 8    Period Weeks    Status Partially Met   1 episode of dizziness with L roll with EC, otherwise asymptomatic today     PT LONG TERM GOAL #4   Title Patient to demonstrate mild sway with M-CTSIB condition with eyes closed/foam surface in order to improve safety in environments with uneven surfaces and dim lighting.    Time 8    Period Weeks    Status Achieved      PT LONG TERM GOAL #5   Title Patient to score at least 20/24 on DGI in order to decrease risk of falls.    Time 8    Period Weeks    Status Achieved                   Plan - 05/20/21 0946     Clinical Impression Statement Patient arrived to session with report of 80% improvement in dizziness since initial eval. Still notes intermittent episodes of dizziness with rolling, especially with EC, and with head turns. Now notes 0-2/10 dizziness with standing VOR. Patient has met goal for balance on compliant surface, which is now normal. Re-assessed roll test with EO and EC which was negative for nystagmus. Patient with  1 episode of dizziness with L roll, thus instructed patient on habituating this movement. Patient is no longer noting dizziness with previously administered habituation activities, thus updated HEP to provide increase challenges. Patient reported understanding of HEP update reports that he would like to wrap up with PT at this time. Patient has met or partially met goals and is ready for D/C.    Examination-Activity  Limitations Locomotion Level;Bathing;Bed Mobility;Reach Overhead;Bend;Carry;Sleep;Squat;Stairs;Dressing;Hygiene/Grooming;Stand;Lift    PT Treatment/Interventions ADLs/Self Care Home Management;Canalith Repostioning;Cryotherapy;Electrical Stimulation;Moist Heat;Gait training;Stair training;Functional mobility training;Therapeutic activities;Therapeutic exercise;Balance training;Manual techniques;Patient/family education;Passive range of motion;Dry needling;Energy conservation;Visual/perceptual remediation/compensation;Vestibular;Taping    PT Next Visit Plan D/C at this time    Consulted and Agree with Plan of Care Patient             Patient will benefit from skilled therapeutic intervention in order to improve the following deficits and impairments:  Dizziness, Decreased activity tolerance, Decreased balance, Improper body mechanics, Postural dysfunction  Visit Diagnosis: Dizziness and giddiness  Unsteadiness on feet     Problem List There are no problems to display for this patient.   PHYSICAL THERAPY DISCHARGE SUMMARY  Visits from Start of Care: 4  Current functional level related to goals / functional outcomes: See above clinical impression   Remaining deficits: Mild dizziness with VOR and rolling   Education / Equipment: HEP  Plan: Patient agrees to discharge.  Patient goals were partially met. Patient is being discharged per his request.     Janene Harvey, PT, DPT 05/20/21 9:52 AM   [facility] [address], [place], [place], [address] Phone: [phone]   Fax:  [phone]  Name: Justin Guzman MRN: [id number] Date of Birth: [birth year]

## 2021-06-15 ENCOUNTER — Ambulatory Visit: Payer: 59 | Admitting: Psychiatry

## 2021-07-05 ENCOUNTER — Ambulatory Visit: Payer: 59 | Admitting: Psychiatry

## 2021-07-05 ENCOUNTER — Encounter: Payer: Self-pay | Admitting: Psychiatry

## 2021-07-05 VITALS — BP 150/93 | HR 81 | Ht 71.0 in | Wt 230.0 lb

## 2021-07-05 DIAGNOSIS — R42 Dizziness and giddiness: Secondary | ICD-10-CM | POA: Diagnosis not present

## 2021-07-05 DIAGNOSIS — Z0000 Encounter for general adult medical examination without abnormal findings: Secondary | ICD-10-CM

## 2021-07-05 NOTE — Progress Notes (Signed)
[facility]  PATIENT: Justin Guzman DOB: 07/26/1995  REFERRING CLINICIAN: Ivonne Andrew, NP HISTORY FROM: self REASON FOR VISIT: vertigo   HISTORICAL  CHIEF COMPLAINT:  Chief Complaint  Patient presents with   Dizziness    RM 1 alone Pt is well, has ben having dizziness for about 3-4 months. He first noticed it when he stands up he gets very dizziness and off balance and when turns around too fast.  Mostly when eyes are closed. No onset triggers he is aware of or No nausea. Has had two falls due to dizziness.     HISTORY OF PRESENT ILLNESS:  The patient presents for evaluation of vertigo which began 3-4 months ago. At that time he got out of bed and went to grab something out of his night stand. Suddenly felt like the room was spinning around him and fell to the ground. Wife had to pick him up and put him back in the bed. Vertigo lasted for 10 seconds, then resolved. No associated headaches, lightheadedness, nausea, tinnitus, or hearing loss. However he continued to develop episodes of vertigo every time he would turn in bed. 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 He was given meclizine and referred to vestibular therapy.  He went to 5-6 sessions of vestibular rehab and has significantly improved. He now will only have occasional dizziness when he turns over in bed or turns his head too quickly. Dizziness will last 5-7 seconds and then remit.  OTHER MEDICAL CONDITIONS: HTN   REVIEW OF SYSTEMS: Full 14 system review of systems performed and negative with exception of: vertigo  ALLERGIES: No Known Allergies  HOME MEDICATIONS: Outpatient Medications Prior to Visit  Medication Sig Dispense Refill   losartan (COZAAR) 25 MG tablet Take 1 tablet (25 mg total) by mouth daily. 90 tablet 0   meclizine (ANTIVERT) 12.5 MG tablet Take 1 tablet (12.5 mg total) by mouth 3 (three) times daily as needed for dizziness. 30 tablet 0   cetirizine (ZYRTEC ALLERGY) 10  MG tablet Take 1 tablet (10 mg total) by mouth daily. 30 tablet 0   fluticasone (FLONASE) 50 MCG/ACT nasal spray Place 2 sprays into both nostrils daily. 1 g 0   ipratropium (ATROVENT) 0.06 % nasal spray Place 2 sprays into both nostrils 4 (four) times daily. 15 mL 12   No facility-administered medications prior to visit.    PAST MEDICAL HISTORY: Past Medical History:  Diagnosis Date   Undescended and retracted testis     PAST SURGICAL HISTORY: Past Surgical History:  Procedure Laterality Date   SURGERY SCROTAL / TESTICULAR      FAMILY HISTORY: History reviewed. No pertinent family history.  SOCIAL HISTORY: Social History   Socioeconomic History   Marital status: Single    Spouse name: Not on file   Number of children: Not on file   Years of education: Not on file   Highest education level: Not on file  Occupational History   Not on file  Tobacco Use   Smoking status: Never   Smokeless tobacco: Never  Substance and Sexual Activity   Alcohol use: Not Currently   Drug use: Not on file   Sexual activity: Not on file  Other Topics Concern   Not on file  Social History Narrative   Not on file   Social Determinants of Health   Financial Resource Strain: Not on file  Food Insecurity: Not on file  Transportation Needs: Not on file  Physical  Activity: Not on file  Stress: Not on file  Social Connections: Not on file  Intimate Partner Violence: Not on file     PHYSICAL EXAM  GENERAL EXAM/CONSTITUTIONAL: Vitals:  Vitals:   07/05/21 1247  BP: (!) 150/93  Pulse: 81  Weight: 230 lb (104.3 kg)  Height: 5\' 11"  (1.803 m)   Body mass index is 32.08 kg/m. Wt Readings from Last 3 Encounters:  07/05/21 230 lb (104.3 kg)  05/10/13 148 lb 2.4 oz (67.2 kg) (69 %, Z= 0.50)*   * Growth percentiles are based on CDC (Boys, 2-20 Years) data.   Patient is in no distress; well developed, nourished and groomed; neck is supple  CARDIOVASCULAR: Examination of peripheral  vascular system by observation and palpation is normal  EYES: Pupils round and reactive to light, Visual fields full to confrontation, Extraocular movements intacts  MUSCULOSKELETAL: Gait, strength, tone, movements noted in Neurologic exam below  NEUROLOGIC: MENTAL STATUS:  awake, alert, oriented to person, place and time recent and remote memory intact normal attention and concentration   CRANIAL NERVE:  2nd, 3rd, 4th, 6th - pupils equal and reactive to light, visual fields full to confrontation, extraocular muscles intact, no nystagmus 5th - facial sensation symmetric 7th - facial strength symmetric 8th - hearing intact 9th - palate elevates symmetrically, uvula midline 11th - shoulder shrug symmetric 12th - tongue protrusion midline  MOTOR:  normal bulk and tone, full strength in the BUE, BLE  SENSORY:  normal and symmetric to light touch all 4 extremities  COORDINATION:  finger-nose-finger, heel-to-shin normal bilaterally  REFLEXES:  deep tendon reflexes present and symmetric  GAIT/STATION:  normal  No vertigo elicited on Dix-Hallpike today.   DIAGNOSTIC DATA (LABS, IMAGING, TESTING) - I reviewed patient records, labs, notes, testing and imaging myself where available.  none   ASSESSMENT AND PLAN  26 y.o. year old male with a history of HTN who presents for evaluation of vertigo. Clinical presentation is consistent with BPPV. He has had significant improvement in vertigo with vestibular therapy. Neurological exam today is normal. He is not dizzy today, and no vertigo is elicited on Dix-Hallpike maneuver. Discussed using PT exercises at home if vertigo returns. He will return to clinic if he develops worsening vertigo or new neurologic signs.  He does not currently have a PCP and would like to establish care with one. Referral to [facility] placed to help him get established.   1. Healthcare maintenance       PLAN: -Continue home PT exercises as  needed -Referral for PCP to establish care -Return to clinic if worsening vertigo or new neurologic symptoms occur  Orders Placed This Encounter  Procedures   Ambulatory referral to [facility]    No orders of the defined types were placed in this encounter.   Return if symptoms worsen or fail to improve.    [facility], MD 07/05/21 1:24 PM  I spent an average of 25 minutes chart reviewing and counseling the patient, with at least 50% of the time face to face with the patient.   [facility] [address] [place], [place] [phone]

## 2021-07-07 ENCOUNTER — Telehealth: Payer: Self-pay | Admitting: Psychiatry

## 2021-07-07 NOTE — Telephone Encounter (Signed)
Referral sent to [facility] [phone].

## 2021-07-20 NOTE — Telephone Encounter (Signed)
Referral sent to [facility] [phone]. °
